# Patient Record
Sex: Male | Born: 1955 | Race: White | Hispanic: No | Marital: Married | Smoking: Current every day smoker
Health system: Southern US, Community
[De-identification: ages and names within clinical notes are randomized; demographics above are authoritative.]

## PROBLEM LIST (undated history)

## (undated) DIAGNOSIS — K219 Gastro-esophageal reflux disease without esophagitis: Secondary | ICD-10-CM

## (undated) DIAGNOSIS — A0472 Enterocolitis due to Clostridium difficile, not specified as recurrent: Secondary | ICD-10-CM

## (undated) DIAGNOSIS — K824 Cholesterolosis of gallbladder: Secondary | ICD-10-CM

## (undated) DIAGNOSIS — N419 Inflammatory disease of prostate, unspecified: Secondary | ICD-10-CM

## (undated) HISTORY — PX: DENTAL SURGERY: SHX609

## (undated) HISTORY — DX: Enterocolitis due to Clostridium difficile, not specified as recurrent: A04.72

## (undated) HISTORY — DX: Cholesterolosis of gallbladder: K82.4

## (undated) HISTORY — DX: Inflammatory disease of prostate, unspecified: N41.9

---

## 2007-05-31 ENCOUNTER — Emergency Department: Payer: Self-pay | Admitting: Emergency Medicine

## 2010-10-26 ENCOUNTER — Ambulatory Visit: Payer: Self-pay | Admitting: Specialist

## 2012-12-12 ENCOUNTER — Ambulatory Visit: Payer: Self-pay | Admitting: Urology

## 2013-10-11 ENCOUNTER — Ambulatory Visit: Payer: Self-pay | Admitting: Gastroenterology

## 2014-02-04 HISTORY — PX: COLONOSCOPY: SHX174

## 2014-04-07 ENCOUNTER — Encounter: Payer: Self-pay | Admitting: Gastroenterology

## 2014-04-18 ENCOUNTER — Other Ambulatory Visit: Payer: Self-pay

## 2014-04-18 ENCOUNTER — Encounter: Payer: Self-pay | Admitting: Gastroenterology

## 2014-04-18 ENCOUNTER — Ambulatory Visit (INDEPENDENT_AMBULATORY_CARE_PROVIDER_SITE_OTHER): Payer: BC Managed Care – PPO | Admitting: Gastroenterology

## 2014-04-18 VITALS — BP 128/76 | HR 85 | Temp 97.4°F | Ht 69.0 in | Wt 155.0 lb

## 2014-04-18 DIAGNOSIS — K824 Cholesterolosis of gallbladder: Secondary | ICD-10-CM | POA: Insufficient documentation

## 2014-04-18 DIAGNOSIS — R1013 Epigastric pain: Secondary | ICD-10-CM

## 2014-04-18 DIAGNOSIS — A047 Enterocolitis due to Clostridium difficile: Secondary | ICD-10-CM

## 2014-04-18 DIAGNOSIS — A0472 Enterocolitis due to Clostridium difficile, not specified as recurrent: Secondary | ICD-10-CM

## 2014-04-18 MED ORDER — OMEPRAZOLE 40 MG PO CPDR: 40.0000 mg | DELAYED_RELEASE_CAPSULE | Freq: Every day | ORAL | Status: DC

## 2014-04-18 MED ORDER — ONDANSETRON HCL 4 MG PO TABS: 4.0000 mg | ORAL_TABLET | Freq: Three times a day (TID) | ORAL | Status: DC | PRN

## 2014-04-18 NOTE — Patient Instructions (Signed)
Continue to take Prilosec each morning, 30 minutes before breakfast. I sent in a refill to the pharmacy for Prilosec and Zofran.  We have scheduled you for an upper endoscopy with Dr. Jena Gaussourk in the near future. After the results of this, we will determine if you simply need just an ultrasound of your gallbladder or an ultrasound PLUS a HIDA scan.   Start taking a probiotic daily such as Align, Restora, [employer], [employer] brand.   Further recommendations to follow!

## 2014-04-18 NOTE — Progress Notes (Signed)
Primary Care Physician:  Altamease OilerHARRIS,MEREDITH L, FNP Primary Gastroenterologist:  Dr. Jena Gaussourk  Chief Complaint  Patient presents with  . Referral    HPI:   Frank Ramos presents today at the request of his PCP secondary to abdominal pain.    Last October started having right-sided flank pain, saw urologist. Given several rounds of antibiotics due to prostatitis.  Around March had such severe pain and had to stop [facility] ED [facility]. Had severe cramping and diarrhea, lost about 20 lbs during that time. Was diagnosed with Cdiff, placed on Flagyl. Lost appetite around that time. Had ultrasound May 2015 with 6 mm gallbladder polyp. Simple cyst left lobe of liver and left kidney.   Has regained weight over the summer. While drinking the prep for the colonoscopy was having a large amount of epigastric discomfort. Has persistent intermittent epigastric discomfort. After the colonoscopy had significant reflux, belching. Prilosec prescribed Oct 10th and Zofran. Has not had to take prescription PPIs in the past, only used OTC agents in the past. Zantac helped in the past. Prilosec and Zofran helped immediately with symptoms of GERD and nausea. Takes Prilosec daily. Feels like food sits heavy in epigastric region. Dyspepsia for 8 years but just occasional until this year. No prior EGD. Right-sided flank/right-side pain described as a "stitch", intermittent but still frequent. Originally couldn't nail down when it happened, but now it seems to occur when hungry, stressed, or tired. Eating doesn't make any change. Sometimes burping will help. No melena. Notes history of back injury at age 58, and had been on 600 mg Ibuprofen chronically but backed off in the last year.   Used to hold steady at 165, now at 155.    Past Medical History  Diagnosis Date  . C. difficile colitis   . Prostatitis     Past Surgical History  Procedure Laterality Date  . Colonoscopy    . Dental surgery      Current  Outpatient Prescriptions  Medication Sig Dispense Refill  . finasteride (PROSCAR) 5 MG tablet Take 5 mg by mouth daily.    Marland Kitchen. omeprazole (PRILOSEC) 40 MG capsule Take 40 mg by mouth daily.    . ondansetron (ZOFRAN) 4 MG tablet Take 4 mg by mouth every 8 (eight) hours as needed for nausea or vomiting.    Marland Kitchen. TAMSULOSIN HCL PO Take by mouth daily.     No current facility-administered medications for this visit.    Allergies as of 11/56/2015  . (Not on File)    Family History  Problem Relation Age of Onset  . Colon cancer Neg Hx   . Lung cancer Father     History   Social History  . Marital Status: Married    Spouse Name: N/A    Number of Children: N/A  . Years of Education: N/A   Occupational History  . Retired    Social History Main Topics  . Smoking status: Current Every Day Smoker -- 1.00 packs/day for 40 years    Types: Cigarettes  . Smokeless tobacco: Not on file  . Alcohol Use: 0.0 oz/week    0 Not specified per week     Comment: none recently in past several months, prior to this 1-2 beers per day  . Drug Use: No  . Sexual Activity: Not on file   Other Topics Concern  . Not on file   Social History Narrative  . No narrative on file  Review of Systems: As mentioned in HPI  Physical Exam: BP 128/76 mmHg  Pulse 85  Temp(Src) 97.4 F (36.3 C) (Oral)  Ht 5\' 9"  (1.753 m)  Wt 155 lb (70.308 kg)  BMI 22.88 kg/m2 General:   Alert and oriented. Pleasant and cooperative. Well-nourished and well-developed.  Head:  Normocephalic and atraumatic. Eyes:  Without icterus, sclera clear and conjunctiva pink.  Ears:  Normal auditory acuity. Nose:  No deformity, discharge,  or lesions. Mouth:  No deformity or lesions, oral mucosa pink.  Lungs:  Clear to auscultation bilaterally. No wheezes, rales, or rhonchi. No distress.  Heart:  S1, S2 present without murmurs appreciated.  Abdomen:  +BS, soft, non-tender and non-distended. No HSM noted. No guarding or rebound. No  masses appreciated.  Rectal:  Deferred  Msk:  Symmetrical without gross deformities. Normal posture. Extremities:  Without  edema. Neurologic:  Alert and  oriented x4;  grossly normal neurologically. Skin:  Intact without significant lesions or rashes. Psych:  Alert and cooperative. Normal mood and affect.    CT with renal stone protocol March 2015 [facility]  Impression: 1. No evidence of an obstructive uropathy. 2. Multiple punctate nonobstructing calculi in the bilateral kidneys.  US abdomen May 2015 at [facility]: 6 mm gallbladder polyp, simple cyst left lobe of liver and left kidney

## 2014-04-22 ENCOUNTER — Telehealth: Payer: Self-pay | Admitting: Internal Medicine

## 2014-04-22 ENCOUNTER — Encounter: Payer: Self-pay | Admitting: Gastroenterology

## 2014-04-22 DIAGNOSIS — A0472 Enterocolitis due to Clostridium difficile, not specified as recurrent: Secondary | ICD-10-CM | POA: Insufficient documentation

## 2014-04-22 NOTE — Telephone Encounter (Signed)
[facility]. Please call him at [phone]

## 2014-04-22 NOTE — Assessment & Plan Note (Signed)
58 year old male with chronic epigastric discomfort that has previously been intermittent but now more frequent and associated with significant GERD symptoms, "heaviness" with eating, belching, nausea, and weight loss. Weight loss may be multifactorial in the setting of prolonged illness in March and eventually diagnosis of Cdiff; however, he has no further lower GI symptoms and regained the majority of weight but still 10 lbs less than his baseline. No prior EGD. Historically has used Ibuprofen 600 mg chronically but less in the past year. With worsening epigastric discomfort, GERD, and weight loss, EGD indicated for further assessment of upper GI tract to exclude, gastritis, PUD, occult malignancy. Gallbladder does remain in situ, and US abdomen on file with gallbladder polyp but no other significant findings. Vague right-sided/right-flank pain also noted for at least a year, with thorough evaluation by urology. Triggering factors include hunger, stress, and fatigue. Question multifactorial. If EGD negative, could pursue HIDA. With gallbladder polyp, needs serial ultrasounds.   Proceed with upper endoscopy in the near future with Dr. Jena Gaussourk. The risks, benefits, and alternatives have been discussed in detail with patient. They have stated understanding and desire to proceed.  Phenergan 25 mg IV due to history of daily ETOH use Continue Prilosec once daily Zofran provided for nausea Will proceed with US +/- HIDA AFTER EGD is performed and results on file

## 2014-04-22 NOTE — Telephone Encounter (Signed)
Talked with him and he dose not want to reschedule will keep the same date and time

## 2014-04-22 NOTE — Assessment & Plan Note (Signed)
Diagnosed in March of this year at outside facility. Improvement/resolution with Flagyl. No concerning lower GI symptoms. Colonoscopy on file from Sept 2015 with hyperplastic polyp. No history of adenomatous polyps in past. Next colonoscopy in 2025

## 2014-04-22 NOTE — Assessment & Plan Note (Signed)
Serial ultrasounds. Due for 6 month evaluation now. However, will hold off until after EGD. If EGD negative, pursue US with HIDA. If EGD with significant findings, just pursue US.

## 2014-04-24 ENCOUNTER — Ambulatory Visit (HOSPITAL_COMMUNITY)
Admission: RE | Admit: 2014-04-24 | Discharge: 2014-04-24 | Disposition: A | Discharge status: Home / Self Care | Payer: BC Managed Care – PPO | Source: Ambulatory Visit | Attending: Internal Medicine | Admitting: Internal Medicine

## 2014-04-24 ENCOUNTER — Telehealth: Payer: Self-pay | Admitting: Internal Medicine

## 2014-04-24 ENCOUNTER — Encounter (HOSPITAL_COMMUNITY): Payer: Self-pay | Admitting: *Deleted

## 2014-04-24 ENCOUNTER — Other Ambulatory Visit: Payer: Self-pay

## 2014-04-24 ENCOUNTER — Encounter (HOSPITAL_COMMUNITY)
Admission: RE | Disposition: A | Discharge status: Home / Self Care | Payer: Self-pay | Source: Ambulatory Visit | Attending: Internal Medicine

## 2014-04-24 DIAGNOSIS — R1013 Epigastric pain: Secondary | ICD-10-CM | POA: Diagnosis not present

## 2014-04-24 DIAGNOSIS — R1011 Right upper quadrant pain: Secondary | ICD-10-CM | POA: Insufficient documentation

## 2014-04-24 DIAGNOSIS — K3189 Other diseases of stomach and duodenum: Secondary | ICD-10-CM | POA: Insufficient documentation

## 2014-04-24 DIAGNOSIS — K449 Diaphragmatic hernia without obstruction or gangrene: Secondary | ICD-10-CM | POA: Insufficient documentation

## 2014-04-24 DIAGNOSIS — K824 Cholesterolosis of gallbladder: Secondary | ICD-10-CM

## 2014-04-24 DIAGNOSIS — F17210 Nicotine dependence, cigarettes, uncomplicated: Secondary | ICD-10-CM | POA: Insufficient documentation

## 2014-04-24 DIAGNOSIS — K222 Esophageal obstruction: Secondary | ICD-10-CM | POA: Insufficient documentation

## 2014-04-24 HISTORY — PX: ESOPHAGOGASTRODUODENOSCOPY: SHX5428

## 2014-04-24 HISTORY — DX: Gastro-esophageal reflux disease without esophagitis: K21.9

## 2014-04-24 SURGERY — EGD (ESOPHAGOGASTRODUODENOSCOPY)
Anesthesia: Moderate Sedation

## 2014-04-24 MED ORDER — MIDAZOLAM HCL 5 MG/5ML IJ SOLN
INTRAMUSCULAR | Status: DC | PRN
  Administered 2014-04-24: 2 mg via INTRAVENOUS
  Administered 2014-04-24: 1 mg via INTRAVENOUS

## 2014-04-24 MED ORDER — LIDOCAINE VISCOUS 2 % MT SOLN
OROMUCOSAL | Status: DC | PRN
Start: 2014-04-24 — End: 2014-04-24
  Administered 2014-04-24: 1 via OROMUCOSAL

## 2014-04-24 MED ORDER — MIDAZOLAM HCL 5 MG/5ML IJ SOLN
INTRAMUSCULAR | Status: AC
  Filled 2014-04-24: qty 10

## 2014-04-24 MED ORDER — STERILE WATER FOR IRRIGATION IR SOLN
Status: DC | PRN
  Administered 2014-04-24: 15:00:00

## 2014-04-24 MED ORDER — MEPERIDINE HCL 100 MG/ML IJ SOLN
INTRAMUSCULAR | Status: DC
Start: 2014-04-24 — End: 2014-04-24
  Filled 2014-04-24: qty 2

## 2014-04-24 MED ORDER — ONDANSETRON HCL 4 MG/2ML IJ SOLN
INTRAMUSCULAR | Status: AC
  Filled 2014-04-24: qty 2

## 2014-04-24 MED ORDER — PROMETHAZINE HCL 25 MG/ML IJ SOLN
25.0000 mg | Freq: Once | INTRAMUSCULAR | Status: AC
  Administered 2014-04-24: 25 mg via INTRAVENOUS
  Filled 2014-04-24: qty 1

## 2014-04-24 MED ORDER — MEPERIDINE HCL 100 MG/ML IJ SOLN
INTRAMUSCULAR | Status: DC | PRN
  Administered 2014-04-24: 50 mg via INTRAVENOUS

## 2014-04-24 MED ORDER — LIDOCAINE VISCOUS 2 % MT SOLN
OROMUCOSAL | Status: AC
  Filled 2014-04-24: qty 15

## 2014-04-24 MED ORDER — SODIUM CHLORIDE 0.9 % IV SOLN
INTRAVENOUS | Status: DC
  Administered 2014-04-24: 13:00:00 via INTRAVENOUS

## 2014-04-24 MED ORDER — ONDANSETRON HCL 4 MG/2ML IJ SOLN
INTRAMUSCULAR | Status: DC | PRN
  Administered 2014-04-24: 4 mg via INTRAVENOUS

## 2014-04-24 MED ORDER — SODIUM CHLORIDE 0.9 % IJ SOLN
INTRAMUSCULAR | Status: AC
  Filled 2014-04-24: qty 10

## 2014-04-24 NOTE — Telephone Encounter (Signed)
Short Stay called to let us know that [facility] wants patient set up for a repeat GB ultrasound and HIDA w/CCK in the next 2-3 days.

## 2014-04-24 NOTE — Discharge Instructions (Addendum)
EGD Discharge instructions Please read the instructions outlined below and refer to this sheet in the next few weeks. These discharge instructions provide you with general information on caring for yourself after you leave the hospital. Your doctor may also give you specific instructions. While your treatment has been planned according to the most current medical practices available, unavoidable complications occasionally occur. If you have any problems or questions after discharge, please call your doctor. ACTIVITY  You may resume your regular activity but move at a slower pace for the next 24 hours.   Take frequent rest periods for the next 24 hours.   Walking will help expel (get rid of) the air and reduce the bloated feeling in your abdomen.   No driving for 24 hours (because of the anesthesia (medicine) used during the test).   You may shower.   Do not sign any important legal documents or operate any machinery for 24 hours (because of the anesthesia used during the test).  NUTRITION  Drink plenty of fluids.   You may resume your normal diet.   Begin with a light meal and progress to your normal diet.   Avoid alcoholic beverages for 24 hours or as instructed by your caregiver.  MEDICATIONS  You may resume your normal medications unless your caregiver tells you otherwise.  WHAT YOU CAN EXPECT TODAY  You may experience abdominal discomfort such as a feeling of fullness or gas pains.  FOLLOW-UP  Your doctor will discuss the results of your test with you.  SEEK IMMEDIATE MEDICAL ATTENTION IF ANY OF THE FOLLOWING OCCUR:  Excessive nausea (feeling sick to your stomach) and/or vomiting.   Severe abdominal pain and distention (swelling).   Trouble swallowing.   Temperature over 101 F (37.8 C).   Rectal bleeding or vomiting of blood.     We'll proceed with repeating gallbladder ultrasound and HIDA with CCK in the near future  Further recommendations to follow pending  review of pathology report

## 2014-04-24 NOTE — Op Note (Signed)
[facility] [address] [place], [id number]   ENDOSCOPY PROCEDURE REPORT  PATIENT: Frank Ramos, Frank Ramos  MR#: [id number] BIRTHDATE: 02-Jun-1956 , 58  yrs. old GENDER: male ENDOSCOPIST: R.  Roetta SessionsMichael Darryon Bastin, MD Caleen EssexFACP FACG REFERRED BY:     [facility] PROCEDURE DATE:  04/24/2014 PROCEDURE:  EGD w/ biopsy INDICATIONS:  Dyspepsia; right upper quadrant abdominal pain. MEDICATIONS: Versed 4 mg IV and Demerol 50 mg IV in divided doses. Zofran 4 mg IV and Phenergan 25 mg IV.  Xylocaine gel orally ASA CLASS:      Class III  CONSENT: The risks, benefits, limitations, alternatives and imponderables have been discussed.  The potential for biopsy, esophogeal dilation, etc. have also been reviewed.  Questions have been answered.  All parties agreeable.  Please see the history and physical in the medical record for more information.  DESCRIPTION OF PROCEDURE: After the risks benefits and alternatives of the procedure were thoroughly explained, informed consent was obtained.  The EG-[id number] ([id number] endoscope was introduced through the mouth and advanced to the second portion of the duodenum , limited by Without limitations. The instrument was slowly withdrawn as the mucosa was fully examined.    Noncritical Schatzki's ring otherwise normal esophagus.  Stomach empty.  Multiple linear antral erosions.  Small hiatal hernia.No ulcer or infiltrating process.  The remainder of the gastric mucosa appeared normal; Patent pylorus.  Normal first and second portion of the duodenum.  The abnormal antrum was biopsied for histologic study.  Retroflexed views revealed as previously described.     The scope was then withdrawn from the patient and the procedure completed.  COMPLICATIONS: There were no immediate complications.  ENDOSCOPIC IMPRESSION: Noncritical Schatzki's ring?"not manipulated. Small hiatal hernia. Antral erosions of uncertain significance?"status post  biopsy. Today's findings would not be a satisfactory explanation for patient's symptoms.  RECOMMENDATIONS: Proceed with ultrasound and HIDA/CCK challenge. Follow-up on pathology.  REPEAT EXAM:  eSigned:  R. Roetta SessionsMichael Varetta Chavers, MD Jerrel IvoryFACP [facility] 04/24/2014 3:15 PM    CC:  CPT CODES: ICD CODES:  The ICD and CPT codes recommended by this software are interpretations from the data that the clinical staff has captured with the software.  The verification of the translation of this report to the ICD and CPT codes and modifiers is the sole responsibility of the health care institution and practicing physician where this report was generated.  [employer] will not be held responsible for the validity of the ICD and CPT codes included on this report.  AMA assumes no liability for data contained or not contained herein. CPT is a Publishing rights managerregistered trademark of the [employer].  PATIENT NAME:  Frank Ramos, Frank Ramos MR#: [id number]

## 2014-04-24 NOTE — H&P (View-Only) (Signed)
Primary Care Physician:  Altamease OilerHARRIS,MEREDITH L, FNP Primary Gastroenterologist:  Dr. Jena Gaussourk  Chief Complaint  Patient presents with  . Referral    HPI:   Frank Ramos presents today at the request of his PCP secondary to abdominal pain.    Last October started having right-sided flank pain, saw urologist. Given several rounds of antibiotics due to prostatitis.  Around March had such severe pain and had to stop [facility]. Had severe cramping and diarrhea, lost about 20 lbs during that time. Was diagnosed with Cdiff, placed on Flagyl. Lost appetite around that time. Had ultrasound May 2015 with 6 mm gallbladder polyp. Simple cyst left lobe of liver and left kidney.   Has regained weight over the summer. While drinking the prep for the colonoscopy was having a large amount of epigastric discomfort. Has persistent intermittent epigastric discomfort. After the colonoscopy had significant reflux, belching. Prilosec prescribed Oct 10th and Zofran. Has not had to take prescription PPIs in the past, only used OTC agents in the past. Zantac helped in the past. Prilosec and Zofran helped immediately with symptoms of GERD and nausea. Takes Prilosec daily. Feels like food sits heavy in epigastric region. Dyspepsia for 8 years but just occasional until this year. No prior EGD. Right-sided flank/right-side pain described as a "stitch", intermittent but still frequent. Originally couldn't nail down when it happened, but now it seems to occur when hungry, stressed, or tired. Eating doesn't make any change. Sometimes burping will help. No melena. Notes history of back injury at age 89, and had been on 600 mg Ibuprofen chronically but backed off in the last year.   Used to hold steady at 165, now at 155.    Past Medical History  Diagnosis Date  . C. difficile colitis   . Prostatitis     Past Surgical History  Procedure Laterality Date  . Colonoscopy    . Dental surgery      Current  Outpatient Prescriptions  Medication Sig Dispense Refill  . finasteride (PROSCAR) 5 MG tablet Take 5 mg by mouth daily.    Marland Kitchen. omeprazole (PRILOSEC) 40 MG capsule Take 40 mg by mouth daily.    . ondansetron (ZOFRAN) 4 MG tablet Take 4 mg by mouth every 8 (eight) hours as needed for nausea or vomiting.    Marland Kitchen. TAMSULOSIN HCL PO Take by mouth daily.     No current facility-administered medications for this visit.    Allergies as of 04/18/2014  . (Not on File)    Family History  Problem Relation Age of Onset  . Colon cancer Neg Hx   . Lung cancer Father     History   Social History  . Marital Status: Married    Spouse Name: N/A    Number of Children: N/A  . Years of Education: N/A   Occupational History  . Retired    Social History Main Topics  . Smoking status: Current Every Day Smoker -- 1.00 packs/day for 40 years    Types: Cigarettes  . Smokeless tobacco: Not on file  . Alcohol Use: 0.0 oz/week    0 Not specified per week     Comment: none recently in past several months, prior to this 1-2 beers per day  . Drug Use: No  . Sexual Activity: Not on file   Other Topics Concern  . Not on file   Social History Narrative  . No narrative on file  Review of Systems: As mentioned in HPI  Physical Exam: BP 128/76 mmHg  Pulse 85  Temp(Src) 97.4 F (36.3 C) (Oral)  Ht 5' 9" (1.753 m)  Wt 155 lb (70.308 kg)  BMI 22.88 kg/m2 General:   Alert and oriented. Pleasant and cooperative. Well-nourished and well-developed.  Head:  Normocephalic and atraumatic. Eyes:  Without icterus, sclera clear and conjunctiva pink.  Ears:  Normal auditory acuity. Nose:  No deformity, discharge,  or lesions. Mouth:  No deformity or lesions, oral mucosa pink.  Lungs:  Clear to auscultation bilaterally. No wheezes, rales, or rhonchi. No distress.  Heart:  S1, S2 present without murmurs appreciated.  Abdomen:  +BS, soft, non-tender and non-distended. No HSM noted. No guarding or rebound. No  masses appreciated.  Rectal:  Deferred  Msk:  Symmetrical without gross deformities. Normal posture. Extremities:  Without  edema. Neurologic:  Alert and  oriented x4;  grossly normal neurologically. Skin:  Intact without significant lesions or rashes. Psych:  Alert and cooperative. Normal mood and affect.    CT with renal stone protocol March 2015 [facility]  Impression: 1. No evidence of an obstructive uropathy. 2. Multiple punctate nonobstructing calculi in the bilateral kidneys.  US abdomen May 2015 at [place]: 6 mm gallbladder polyp, simple cyst left lobe of liver and left kidney  

## 2014-04-24 NOTE — Interval H&P Note (Signed)
History and Physical Interval Note:  04/24/2014 2:29 PM  Frank Ramos  has presented today for surgery, with the diagnosis of dyspepisa/gallbladder polyp  The various methods of treatment have been discussed with the patient and family. After consideration of risks, benefits and other options for treatment, the patient has consented to  Procedure(s) with comments: ESOPHAGOGASTRODUODENOSCOPY (EGD) (N/A) - 145  as a surgical intervention .  The patient's history has been reviewed, patient examined, no change in status, stable for surgery.  I have reviewed the patient's chart and labs.  Questions were answered to the patient's satisfaction.     No change. EGD per plan.The risks, benefits, limitations, alternatives and imponderables have been reviewed with the patient. Potential for esophageal dilation, biopsy, etc. have also been reviewed.  Questions have been answered. All parties agreeable.  Frank Ramos

## 2014-04-28 NOTE — Telephone Encounter (Signed)
pts  Gallbladder ultrasound and Hida scan have been scheduled for 05/05/2014. Spoke with pt and informed him to be at [facility] at 830am and to be NPO after midnight on Sun. 05/04/2014.  Ultrasound at 845am and Hida scan at 10:00am.

## 2014-04-28 NOTE — Telephone Encounter (Signed)
GF have you gotten to this?

## 2014-04-28 NOTE — Telephone Encounter (Signed)
Per Frank Ramos with [employer] there is no PA needed

## 2014-04-29 ENCOUNTER — Encounter: Payer: Self-pay | Admitting: Internal Medicine

## 2014-04-29 ENCOUNTER — Encounter (HOSPITAL_COMMUNITY): Payer: Self-pay | Admitting: Internal Medicine

## 2014-04-30 NOTE — Progress Notes (Signed)
cc'ed to pcp °

## 2014-05-05 ENCOUNTER — Ambulatory Visit (HOSPITAL_COMMUNITY)
Admission: RE | Admit: 2014-05-05 | Discharge: 2014-05-05 | Disposition: A | Discharge status: Home / Self Care | Payer: BC Managed Care – PPO | Source: Ambulatory Visit | Attending: Internal Medicine | Admitting: Internal Medicine

## 2014-05-05 ENCOUNTER — Encounter (HOSPITAL_COMMUNITY)
Admission: RE | Admit: 2014-05-05 | Discharge: 2014-05-05 | Disposition: A | Discharge status: Home / Self Care | Payer: BC Managed Care – PPO | Source: Ambulatory Visit | Attending: Internal Medicine | Admitting: Internal Medicine

## 2014-05-05 ENCOUNTER — Encounter (HOSPITAL_COMMUNITY): Payer: Self-pay

## 2014-05-05 DIAGNOSIS — K824 Cholesterolosis of gallbladder: Secondary | ICD-10-CM | POA: Insufficient documentation

## 2014-05-05 DIAGNOSIS — R1011 Right upper quadrant pain: Secondary | ICD-10-CM | POA: Insufficient documentation

## 2014-05-05 MED ORDER — SINCALIDE 5 MCG IJ SOLR
INTRAMUSCULAR | Status: AC
  Administered 2014-05-05: 1.41 ug via INTRAVENOUS
  Filled 2014-05-05: qty 5

## 2014-05-05 MED ORDER — TECHNETIUM TC 99M MEBROFENIN IV KIT
5.0000 | PACK | Freq: Once | INTRAVENOUS | Status: AC | PRN
  Administered 2014-05-05: 5 via INTRAVENOUS

## 2014-05-05 MED ORDER — STERILE WATER FOR INJECTION IJ SOLN
INTRAMUSCULAR | Status: AC
  Administered 2014-05-05: 1.41 mL via INTRAVENOUS
  Filled 2014-05-05: qty 10

## 2014-05-05 MED ORDER — SODIUM CHLORIDE 0.9 % IJ SOLN
INTRAMUSCULAR | Status: AC
  Filled 2014-05-05: qty 12

## 2014-05-06 ENCOUNTER — Ambulatory Visit (HOSPITAL_COMMUNITY): Payer: BC Managed Care – PPO

## 2014-05-14 NOTE — Progress Notes (Signed)
ON RECALL LIST  °

## 2014-09-25 ENCOUNTER — Other Ambulatory Visit: Payer: Self-pay | Admitting: Gastroenterology

## 2014-11-10 ENCOUNTER — Other Ambulatory Visit: Payer: Self-pay | Admitting: Gastroenterology

## 2015-04-07 DEATH — deceased

## 2015-11-19 IMAGING — US US ABDOMEN LIMITED
1 series · 14 of 25 positions shown · non-contrast
Comparison: 10/11/2013.

CLINICAL DATA: Gallbladder polyp.  Right upper quadrant pain .

EXAM:
US ABDOMEN LIMITED - RIGHT UPPER QUADRANT

[Series 1: us abdomen limited · 0.17mm/px · 14 of 60 slices shown]
[im 1/60]
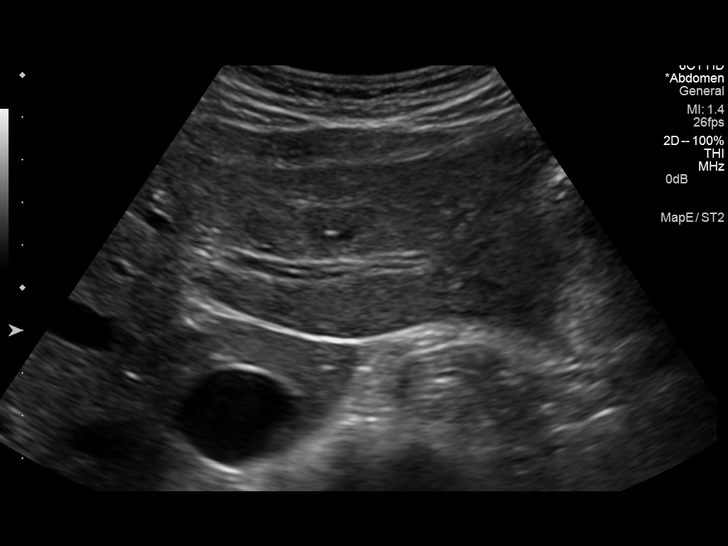
[im 5/60]
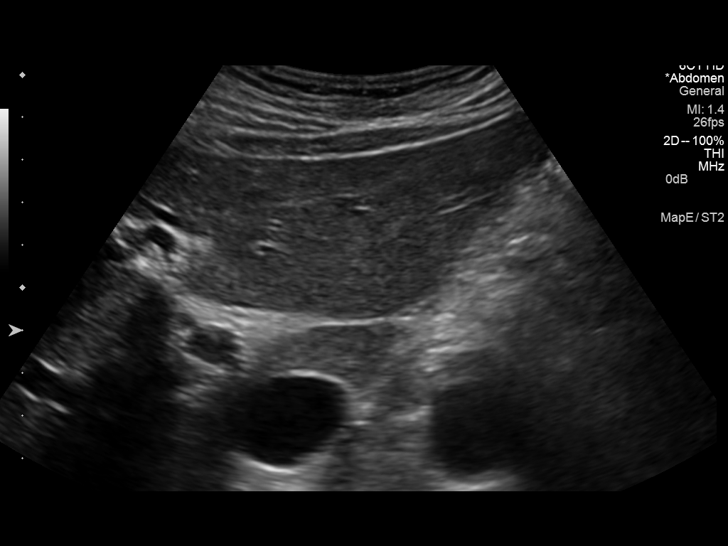
[im 10/60]
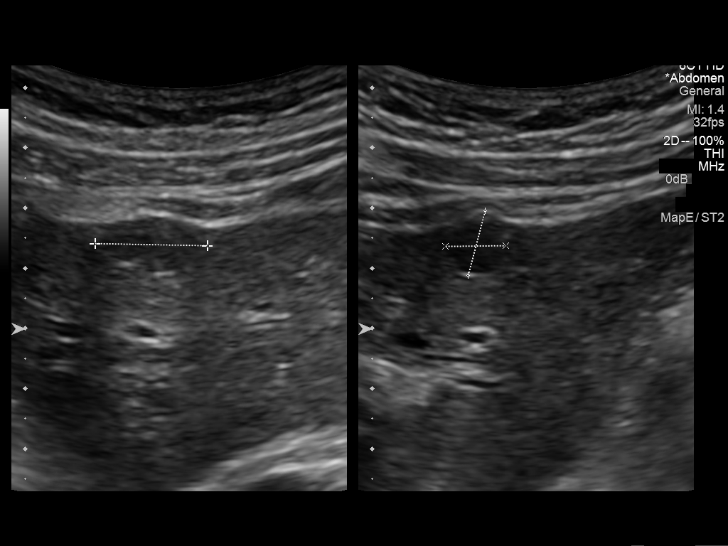
[im 15/60]
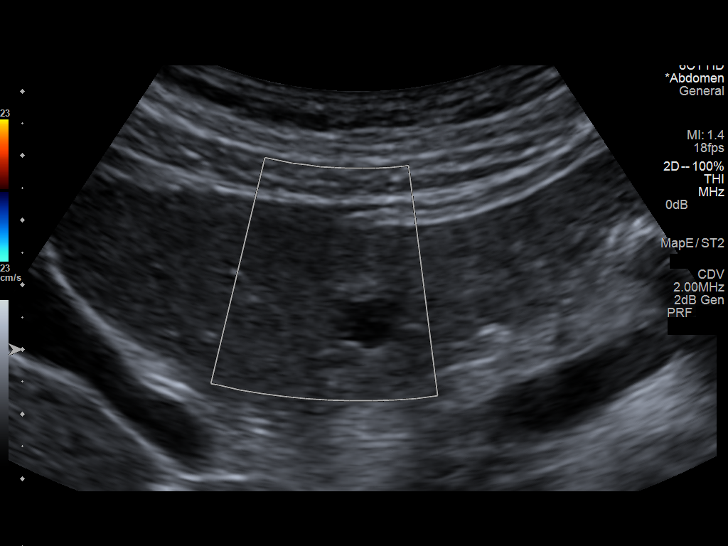
[im 20/60]
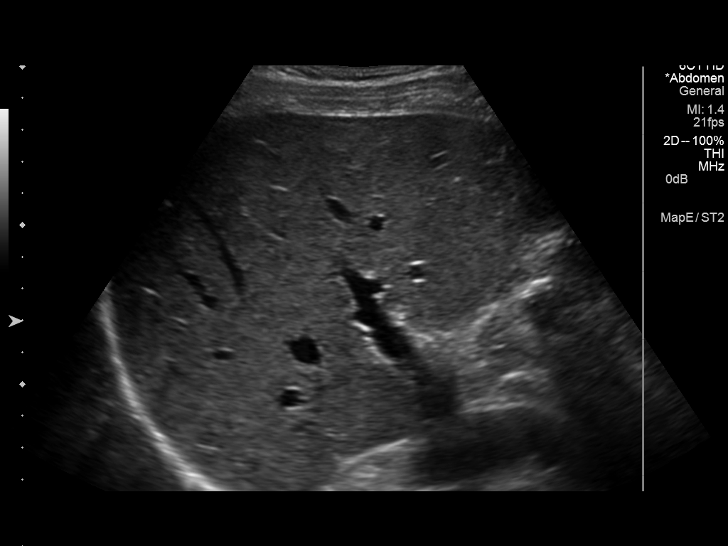
[im 23/60]
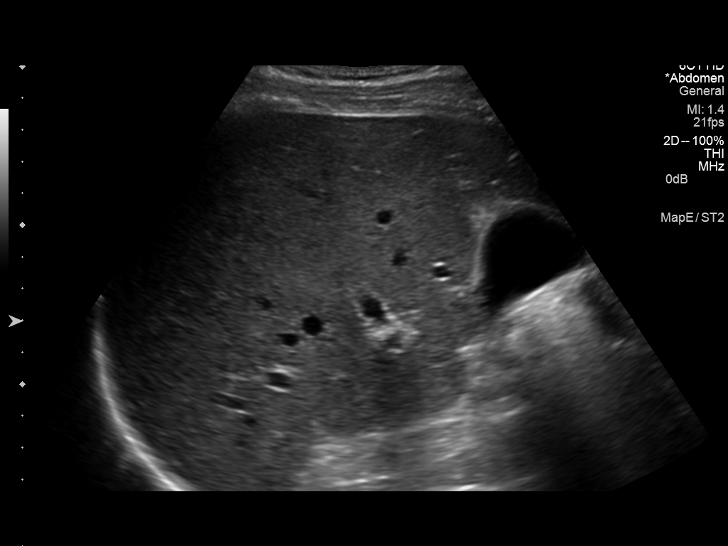
[im 28/60]
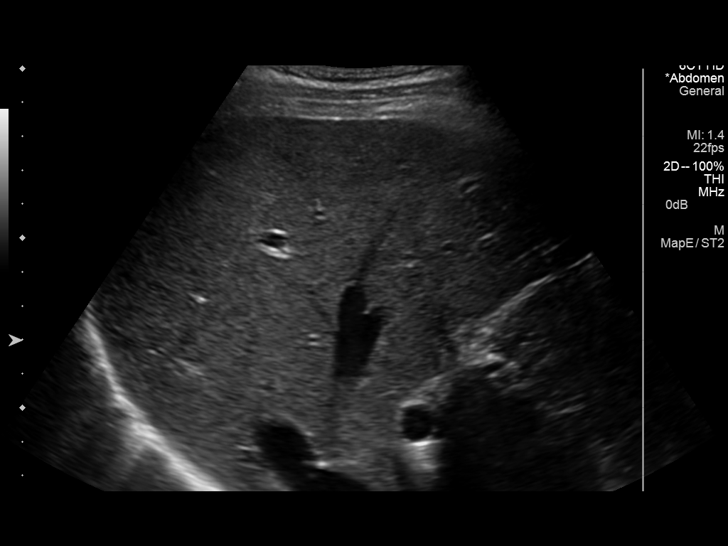
[im 32/60]
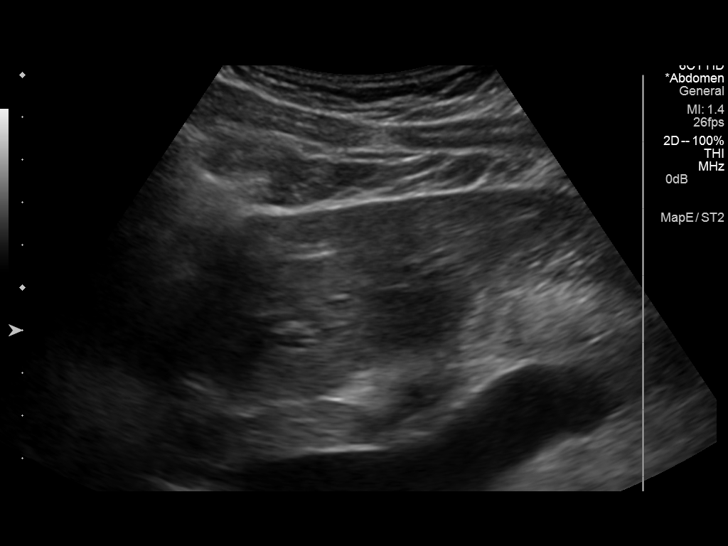
[im 37/60]
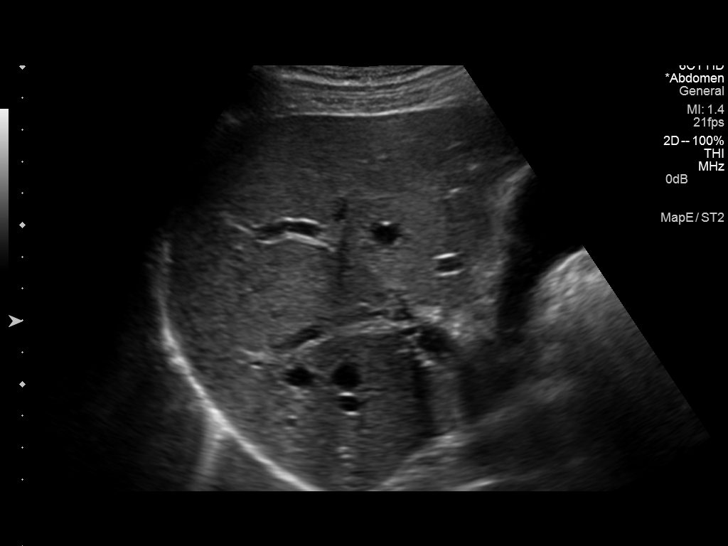
[im 40/60]
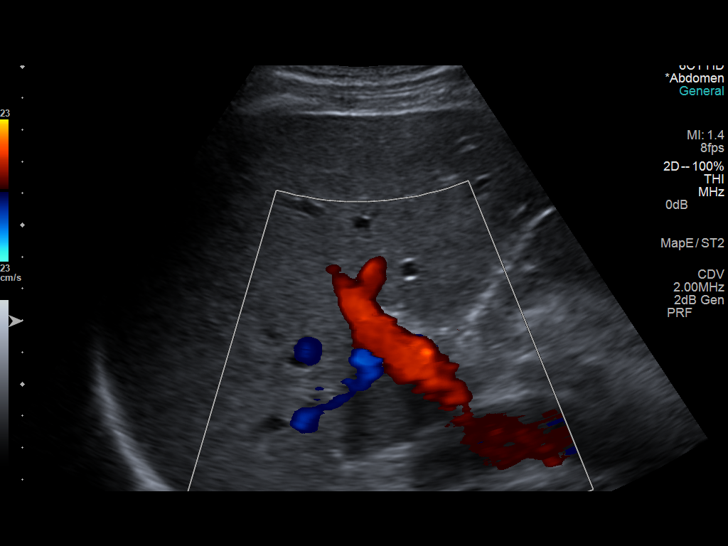
[im 45/60]
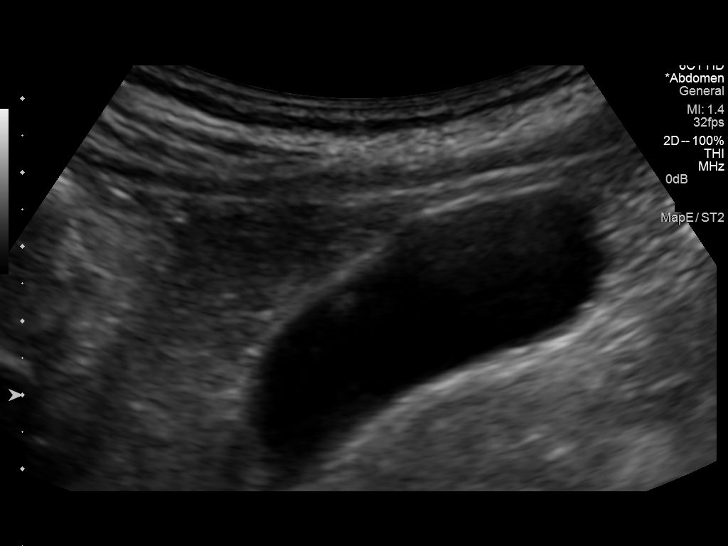
[im 50/60]
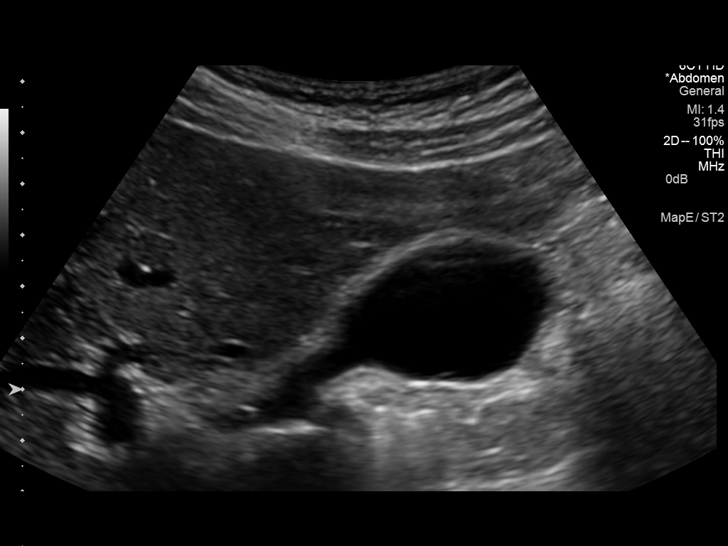
[im 55/60]
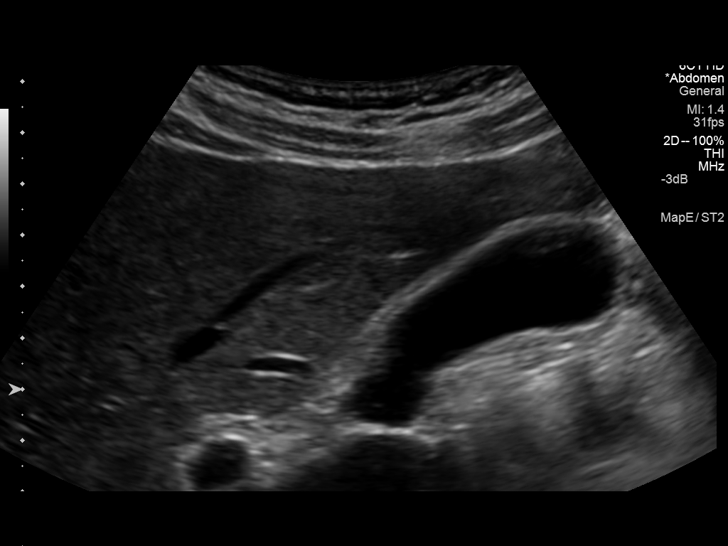
[im 60/60]
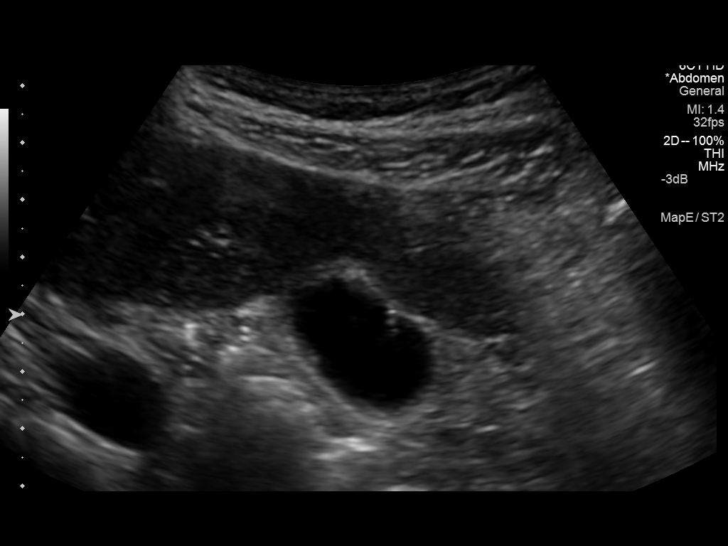

[14 of 25 positions shown; findings below may reference images not displayed]

FINDINGS: Gallbladder:

Multiple non mobile echo densities noted in the gallbladder
consistent with small polyps. Largest measure 6 mm. Gallbladder wall
thickness 2.4 mm. Negative Murphy sign.

Common bile duct:

Diameter: Common bile duct 4.1 mm

Liver:

1.2 cm simple cyst.
IMPRESSION: Tiny gallbladder polyps, largest measures 6 mm.

## 2015-11-19 IMAGING — NM NM HEPATO W/GB/PHARM/[PERSON_NAME]
2 series · 12 of 12 positions shown · non-contrast
Comparison: Abdominal ultrasound October 11, 2013.

CLINICAL DATA: Right upper quadrant abdominal pain for over a year

EXAM:
NUCLEAR MEDICINE HEPATOBILIARY IMAGING WITH GALLBLADDER EF
TECHNIQUE: Sequential images of the abdomen were obtained [DATE] minutes
following intravenous administration of radiopharmaceutical. After
slow intravenous infusion of 1.41 micrograms Cholecystokinin,
gallbladder ejection fraction was determined.
RADIOPHARMACEUTICALS:  5 mCi Millicurie 3c-88m Choletec

[Series 1: biliary · 3.25mm/px · 6 of 60 frames shown]
[frame 6/60]
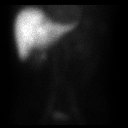
[frame 16/60]
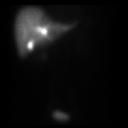
[frame 26/60]
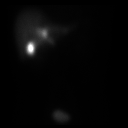
[frame 36/60]
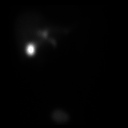
[frame 46/60]
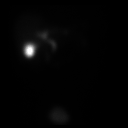
[frame 56/60]
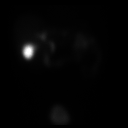

[Series 2: gbef · 3.25mm/px · 6 of 30 frames shown]
[frame 3/30]
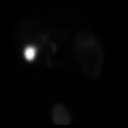
[frame 8/30]
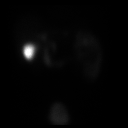
[frame 13/30]
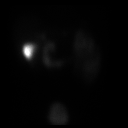
[frame 18/30]
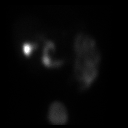
[frame 23/30]
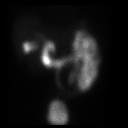
[frame 28/30]
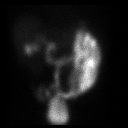

[12 of 12 positions shown; findings below may reference images not displayed]

FINDINGS: There is adequate uptake of the radiopharmaceutical by the liver.
Activity is visible within the intrahepatic ducts and gallbladder by
10 min. Bowel activity is visible by 45 min. The 30 min gallbladder
ejection fraction is 98%.. At 30 min, normal ejection fraction is
expected to the greater than 30%.

The patient did not experience symptoms during CCK infusion.
IMPRESSION: Normal hepatobiliary scan and normal gallbladder ejection fraction.

## 2016-03-21 ENCOUNTER — Telehealth: Payer: Self-pay | Admitting: Internal Medicine

## 2016-03-21 NOTE — Telephone Encounter (Signed)
Letter mailed

## 2016-03-21 NOTE — Telephone Encounter (Signed)
Nov recall for ultrasound  °
